# Patient Record
Sex: Male | Born: 1980 | Race: Black or African American | Hispanic: No | Marital: Single | Smoking: Current every day smoker
Health system: Southern US, Community
[De-identification: ages and names within clinical notes are randomized; demographics above are authoritative.]

---

## 2002-11-10 ENCOUNTER — Encounter: Admission: RE | Admit: 2002-11-10 | Discharge: 2002-11-10 | Payer: Self-pay | Admitting: Emergency Medicine

## 2005-11-01 ENCOUNTER — Emergency Department (HOSPITAL_COMMUNITY): Admission: EM | Admit: 2005-11-01 | Discharge: 2005-11-01 | Payer: Self-pay | Admitting: Emergency Medicine

## 2013-05-15 ENCOUNTER — Emergency Department (HOSPITAL_COMMUNITY)
Admission: EM | Admit: 2013-05-15 | Discharge: 2013-05-15 | Disposition: A | Discharge status: Home / Self Care | Payer: 59 | Source: Home / Self Care | Attending: Family Medicine | Admitting: Family Medicine

## 2013-05-15 ENCOUNTER — Encounter (HOSPITAL_COMMUNITY): Payer: Self-pay | Admitting: Emergency Medicine

## 2013-05-15 DIAGNOSIS — I951 Orthostatic hypotension: Secondary | ICD-10-CM

## 2013-05-15 DIAGNOSIS — R9431 Abnormal electrocardiogram [ECG] [EKG]: Secondary | ICD-10-CM

## 2013-05-15 LAB — POCT I-STAT, CHEM 8
BUN: 22 mg/dL (ref 6–23)
Calcium, Ion: 1.27 mmol/L — ABNORMAL HIGH (ref 1.12–1.23)
Chloride: 105 mEq/L (ref 96–112)
Creatinine, Ser: 1.4 mg/dL — ABNORMAL HIGH (ref 0.50–1.35)
Glucose, Bld: 83 mg/dL (ref 70–99)
HCT: 55 % — ABNORMAL HIGH (ref 39.0–52.0)
Hemoglobin: 18.7 g/dL — ABNORMAL HIGH (ref 13.0–17.0)
Potassium: 3.9 mEq/L (ref 3.7–5.3)
Sodium: 142 mEq/L (ref 137–147)
TCO2: 27 mmol/L (ref 0–100)

## 2013-05-15 MED ORDER — SODIUM CHLORIDE 0.9 % IV BOLUS (SEPSIS)
1000.0000 mL | Freq: Once | INTRAVENOUS | Status: AC
  Administered 2013-05-15: 1000 mL via INTRAVENOUS

## 2013-05-15 NOTE — Discharge Instructions (Signed)
Thank you for coming in today. Drink plenty of fluid.  Followup with your primary care Dr. Your creatinine which is a kidney function is somewhat elevated. This is to be rechecked in the near future. Additionally please followup with a cardiologist. Call or go to the emergency room if you get worse, have trouble breathing, have chest pains, or palpitations.    Orthostatic Hypotension Orthostatic hypotension is a sudden fall in blood pressure. It occurs when a person goes from a sitting or lying position to a standing position. CAUSES   Loss of body fluids (dehydration).  Medicines that lower blood pressure.  Sudden changes in posture, such as sudden standing when you have been sitting or lying down.  Taking too much of your medicine. SYMPTOMS   Lightheadedness or dizziness.  Fainting or near-fainting.  A fast heart rate (tachycardia).  Weakness.  Feeling tired (fatigue). DIAGNOSIS  Your caregiver may find the cause of orthostatic hypotension through:  A history and/or physical exam.  Checking your blood pressure. Your caregiver will check your blood pressure when you are:  Lying down.  Sitting.  Standing.  Tilt table testing. In this test, you are placed on a table that goes from a lying position to a standing position. You will be strapped to the table. This test helps to monitor your blood pressure and heart rate when you are in different positions. TREATMENT   If orthostatic hypotension is caused by your medicines, your caregiver will need to adjust your dosage. Do not stop or adjust your medicine on your own.  When changing positions, make these changes slowly. This allows your body to adjust to the different position.  Compression stockings that are worn on your lower legs may be helpful.  Your caregiver may have you consume extra salt. Do not add extra salt to your diet unless directed by your caregiver.  Eat frequent, small meals. Avoid sudden standing  after eating.  Avoid hot showers or excessive heat.  Your caregiver may give you fluids through the vein (intravenous).  Your caregiver may put you on medicine to help enhance fluid retention. SEEK IMMEDIATE MEDICAL CARE IF:   You faint or have a near-fainting episode. Call your local emergency services (911 in [place]  You have or develop chest pain.  You feel sick to your stomach (nauseous) or vomit.  You have a loss of feeling or movement in your arms or legs.  You have difficulty talking, slurred speech, or you are unable to talk.  You have difficulty thinking or have confused thinking. MAKE SURE YOU:   Understand these instructions.  Will watch your condition.  Will get help right away if you are not doing well or get worse. Document Released: 12/12/2001 Document Revised: 03/16/2011 Document Reviewed: 04/06/2008 [facility] Patient Information 2014 [place], [place].
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
## 2013-05-15 NOTE — ED Notes (Signed)
Onset yesterday of feeling nauseated, no vomiting, c/o dizziness regardless of positin of body.  Reports feeling very tired yesterday and sleeping alot

## 2013-07-26 ENCOUNTER — Institutional Professional Consult (permissible substitution): Payer: 59 | Admitting: Cardiology

## 2013-08-09 ENCOUNTER — Encounter: Payer: Self-pay | Admitting: Cardiology

## 2017-05-23 ENCOUNTER — Emergency Department (HOSPITAL_COMMUNITY): Payer: Self-pay

## 2017-05-23 ENCOUNTER — Encounter (HOSPITAL_COMMUNITY): Payer: Self-pay | Admitting: Emergency Medicine

## 2017-05-23 ENCOUNTER — Emergency Department (HOSPITAL_COMMUNITY)
Admission: EM | Admit: 2017-05-23 | Discharge: 2017-05-23 | Disposition: A | Discharge status: Home / Self Care | Payer: Self-pay | Attending: Emergency Medicine | Admitting: Emergency Medicine

## 2017-05-23 DIAGNOSIS — Z789 Other specified health status: Secondary | ICD-10-CM | POA: Insufficient documentation

## 2017-05-23 DIAGNOSIS — S0181XA Laceration without foreign body of other part of head, initial encounter: Secondary | ICD-10-CM

## 2017-05-23 DIAGNOSIS — D75839 Thrombocytosis, unspecified: Secondary | ICD-10-CM

## 2017-05-23 DIAGNOSIS — Z7289 Other problems related to lifestyle: Secondary | ICD-10-CM

## 2017-05-23 DIAGNOSIS — S02401B Maxillary fracture, unspecified, initial encounter for open fracture: Secondary | ICD-10-CM | POA: Insufficient documentation

## 2017-05-23 DIAGNOSIS — S0232XB Fracture of orbital floor, left side, initial encounter for open fracture: Secondary | ICD-10-CM | POA: Insufficient documentation

## 2017-05-23 DIAGNOSIS — Z23 Encounter for immunization: Secondary | ICD-10-CM | POA: Insufficient documentation

## 2017-05-23 DIAGNOSIS — S0502XA Injury of conjunctiva and corneal abrasion without foreign body, left eye, initial encounter: Secondary | ICD-10-CM | POA: Insufficient documentation

## 2017-05-23 DIAGNOSIS — S01412A Laceration without foreign body of left cheek and temporomandibular area, initial encounter: Secondary | ICD-10-CM | POA: Insufficient documentation

## 2017-05-23 DIAGNOSIS — Y939 Activity, unspecified: Secondary | ICD-10-CM | POA: Insufficient documentation

## 2017-05-23 DIAGNOSIS — S0240DB Maxillary fracture, left side, initial encounter for open fracture: Secondary | ICD-10-CM | POA: Insufficient documentation

## 2017-05-23 DIAGNOSIS — D473 Essential (hemorrhagic) thrombocythemia: Secondary | ICD-10-CM | POA: Insufficient documentation

## 2017-05-23 DIAGNOSIS — Y999 Unspecified external cause status: Secondary | ICD-10-CM | POA: Insufficient documentation

## 2017-05-23 DIAGNOSIS — Z79899 Other long term (current) drug therapy: Secondary | ICD-10-CM | POA: Insufficient documentation

## 2017-05-23 DIAGNOSIS — S022XXB Fracture of nasal bones, initial encounter for open fracture: Secondary | ICD-10-CM | POA: Insufficient documentation

## 2017-05-23 DIAGNOSIS — Y929 Unspecified place or not applicable: Secondary | ICD-10-CM | POA: Insufficient documentation

## 2017-05-23 LAB — COMPREHENSIVE METABOLIC PANEL
ALT: 36 U/L (ref 17–63)
AST: 26 U/L (ref 15–41)
Albumin: 4 g/dL (ref 3.5–5.0)
Alkaline Phosphatase: 60 U/L (ref 38–126)
Anion gap: 12 (ref 5–15)
BUN: 15 mg/dL (ref 6–20)
CO2: 23 mmol/L (ref 22–32)
Calcium: 9.3 mg/dL (ref 8.9–10.3)
Chloride: 106 mmol/L (ref 101–111)
Creatinine, Ser: 0.96 mg/dL (ref 0.61–1.24)
GFR calc Af Amer: >60 mL/min (ref >60)
GFR calc non Af Amer: >60 mL/min (ref >60)
Glucose, Bld: 78 mg/dL (ref 65–99)
Potassium: 4.1 mmol/L (ref 3.5–5.1)
Sodium: 141 mmol/L (ref 135–145)
Total Bilirubin: 0.5 mg/dL (ref 0.3–1.2)
Total Protein: 7.9 g/dL (ref 6.5–8.1)

## 2017-05-23 LAB — CBC WITH DIFFERENTIAL/PLATELET
Basophils Absolute: 0 10*3/uL (ref 0.0–0.1)
Basophils Relative: 0 %
Eosinophils Absolute: 0.4 10*3/uL (ref 0.0–0.7)
Eosinophils Relative: 4 %
HCT: 41.8 % (ref 39.0–52.0)
Hemoglobin: 13.8 g/dL (ref 13.0–17.0)
Lymphocytes Relative: 33 %
Lymphs Abs: 3.2 10*3/uL (ref 0.7–4.0)
MCH: 24.1 pg — ABNORMAL LOW (ref 26.0–34.0)
MCHC: 33 g/dL (ref 30.0–36.0)
MCV: 72.9 fL — ABNORMAL LOW (ref 78.0–100.0)
Monocytes Absolute: 0.8 10*3/uL (ref 0.1–1.0)
Monocytes Relative: 8 %
Neutro Abs: 5.4 10*3/uL (ref 1.7–7.7)
Neutrophils Relative %: 55 %
Platelets: 454 10*3/uL — ABNORMAL HIGH (ref 150–400)
RBC: 5.73 MIL/uL (ref 4.22–5.81)
RDW: 15.8 % — ABNORMAL HIGH (ref 11.5–15.5)
WBC: 9.9 10*3/uL (ref 4.0–10.5)

## 2017-05-23 LAB — I-STAT CG4 LACTIC ACID, ED: Lactic Acid, Venous: 2.25 mmol/L (ref 0.5–1.9)

## 2017-05-23 LAB — APTT: aPTT: 27 seconds (ref 24–36)

## 2017-05-23 LAB — ETHANOL: Alcohol, Ethyl (B): 113 mg/dL — ABNORMAL HIGH (ref <10)

## 2017-05-23 LAB — RAPID URINE DRUG SCREEN, HOSP PERFORMED
Amphetamines: NOT DETECTED
Barbiturates: NOT DETECTED
Benzodiazepines: NOT DETECTED
Cocaine: POSITIVE — AB
Opiates: NOT DETECTED
Tetrahydrocannabinol: POSITIVE — AB

## 2017-05-23 LAB — URINALYSIS, ROUTINE W REFLEX MICROSCOPIC
Bilirubin Urine: NEGATIVE
Glucose, UA: NEGATIVE mg/dL
Hgb urine dipstick: NEGATIVE
Ketones, ur: NEGATIVE mg/dL
Leukocytes, UA: NEGATIVE
Nitrite: NEGATIVE
Protein, ur: NEGATIVE mg/dL
Specific Gravity, Urine: 1.021 (ref 1.005–1.030)
pH: 6 (ref 5.0–8.0)

## 2017-05-23 LAB — PROTIME-INR
INR: 1.06
Prothrombin Time: 13.7 seconds (ref 11.4–15.2)

## 2017-05-23 MED ORDER — MORPHINE SULFATE (PF) 4 MG/ML IV SOLN
4.0000 mg | Freq: Once | INTRAVENOUS | Status: AC
  Administered 2017-05-23: 4 mg via INTRAVENOUS
  Filled 2017-05-23: qty 1

## 2017-05-23 MED ORDER — ONDANSETRON HCL 4 MG/2ML IJ SOLN
4.0000 mg | Freq: Once | INTRAMUSCULAR | Status: AC
  Administered 2017-05-23: 4 mg via INTRAVENOUS
  Filled 2017-05-23: qty 2

## 2017-05-23 MED ORDER — SODIUM CHLORIDE 0.9 % IV SOLN
INTRAVENOUS | Status: DC
  Administered 2017-05-23: 16:00:00 via INTRAVENOUS

## 2017-05-23 MED ORDER — FLUORESCEIN SODIUM 1 MG OP STRP
1.0000 | ORAL_STRIP | Freq: Once | OPHTHALMIC | Status: AC
  Administered 2017-05-23: 1 via OPHTHALMIC
  Filled 2017-05-23: qty 1

## 2017-05-23 MED ORDER — CEFAZOLIN SODIUM-DEXTROSE 1-4 GM/50ML-% IV SOLN
1.0000 g | Freq: Once | INTRAVENOUS | Status: AC
  Administered 2017-05-23: 1 g via INTRAVENOUS
  Filled 2017-05-23: qty 50

## 2017-05-23 MED ORDER — TETRACAINE HCL 0.5 % OP SOLN
2.0000 [drp] | Freq: Once | OPHTHALMIC | Status: AC
  Administered 2017-05-23: 2 [drp] via OPHTHALMIC
  Filled 2017-05-23: qty 4

## 2017-05-23 MED ORDER — BACITRACIN ZINC 500 UNIT/GM EX OINT
TOPICAL_OINTMENT | CUTANEOUS | Status: AC
  Filled 2017-05-23: qty 0.9

## 2017-05-23 MED ORDER — TETANUS-DIPHTH-ACELL PERTUSSIS 5-2.5-18.5 LF-MCG/0.5 IM SUSP
0.5000 mL | Freq: Once | INTRAMUSCULAR | Status: AC
  Administered 2017-05-23: 0.5 mL via INTRAMUSCULAR
  Filled 2017-05-23: qty 0.5

## 2017-05-23 MED ORDER — DOXYCYCLINE HYCLATE 100 MG PO CAPS: 100.0000 mg | ORAL_CAPSULE | Freq: Two times a day (BID) | ORAL | 0 refills | Status: DC

## 2017-05-23 MED ORDER — NAPROXEN 500 MG PO TABS: 500.0000 mg | ORAL_TABLET | Freq: Two times a day (BID) | ORAL | 0 refills | Status: DC | PRN

## 2017-05-23 MED ORDER — HYDROCODONE-ACETAMINOPHEN 5-325 MG PO TABS: 1.0000 | ORAL_TABLET | Freq: Four times a day (QID) | ORAL | 0 refills | Status: DC | PRN

## 2017-05-23 MED ORDER — SODIUM CHLORIDE 0.9 % IV BOLUS
1000.0000 mL | Freq: Once | INTRAVENOUS | Status: AC
  Administered 2017-05-23: 1000 mL via INTRAVENOUS

## 2017-05-23 MED ORDER — CIPROFLOXACIN HCL 0.3 % OP SOLN: 2.0000 [drp] | Freq: Four times a day (QID) | OPHTHALMIC | 0 refills | Status: DC
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
## 2017-05-23 NOTE — Discharge Instructions (Addendum)
You have several fractures in the left side of your face. You will need to eat a liquid diet until cleared by the ENT doctor. Take antibiotics as directed until completed, since you had open fractures. Also use the eye drops as directed, and avoid rubbing your eyes. Do not wear contact lenses.  Keep wounds clean with mild soap and water. Keep area covered with a bandage, keep bandage dry, and do not submerge in water. DO NOT PUT ANY OINTMENTS/CREAMS/LOTIONS ON THE AREA AS THIS WILL PREMATURELY DISSOLVE THE GLUE. Ice and elevate areas for additional pain and swelling relief. Alternate between naprosyn and norco for additional pain relief, but don't drive or operate machinery while taking norco. STOP DRINKING ALCOHOL!  Call the ENT doctor tomorrow at the number listed above, and set up an appointment for Tuesday 05/25/17 for ongoing management of your fractures and wounds. Monitor area for signs of infection to include, but not limited to: increasing pain, spreading redness, drainage/pus, worsening swelling, or fevers. Return to emergency department for emergent changing or worsening symptoms.

## 2017-05-25 DIAGNOSIS — S022XXA Fracture of nasal bones, initial encounter for closed fracture: Secondary | ICD-10-CM | POA: Insufficient documentation

## 2017-05-25 DIAGNOSIS — S0240DB Maxillary fracture, left side, initial encounter for open fracture: Secondary | ICD-10-CM | POA: Insufficient documentation

## 2017-09-19 ENCOUNTER — Encounter (HOSPITAL_COMMUNITY): Payer: Self-pay | Admitting: Emergency Medicine

## 2017-09-19 ENCOUNTER — Emergency Department (HOSPITAL_COMMUNITY)
Admission: EM | Admit: 2017-09-19 | Discharge: 2017-09-19 | Disposition: A | Discharge status: Home / Self Care | Payer: Self-pay | Attending: Emergency Medicine | Admitting: Emergency Medicine

## 2017-09-19 ENCOUNTER — Emergency Department (HOSPITAL_COMMUNITY): Payer: Self-pay

## 2017-09-19 ENCOUNTER — Other Ambulatory Visit: Payer: Self-pay

## 2017-09-19 DIAGNOSIS — W108XXA Fall (on) (from) other stairs and steps, initial encounter: Secondary | ICD-10-CM | POA: Insufficient documentation

## 2017-09-19 DIAGNOSIS — Y939 Activity, unspecified: Secondary | ICD-10-CM | POA: Insufficient documentation

## 2017-09-19 DIAGNOSIS — Y999 Unspecified external cause status: Secondary | ICD-10-CM | POA: Insufficient documentation

## 2017-09-19 DIAGNOSIS — S82191A Other fracture of upper end of right tibia, initial encounter for closed fracture: Secondary | ICD-10-CM | POA: Insufficient documentation

## 2017-09-19 DIAGNOSIS — S82114A Nondisplaced fracture of right tibial spine, initial encounter for closed fracture: Secondary | ICD-10-CM | POA: Insufficient documentation

## 2017-09-19 DIAGNOSIS — F17200 Nicotine dependence, unspecified, uncomplicated: Secondary | ICD-10-CM | POA: Insufficient documentation

## 2017-09-19 DIAGNOSIS — Y929 Unspecified place or not applicable: Secondary | ICD-10-CM | POA: Insufficient documentation

## 2017-09-19 DIAGNOSIS — Z79899 Other long term (current) drug therapy: Secondary | ICD-10-CM | POA: Insufficient documentation

## 2017-09-19 MED ORDER — HYDROCODONE-ACETAMINOPHEN 5-325 MG PO TABS: 1.0000 | ORAL_TABLET | Freq: Four times a day (QID) | ORAL | 0 refills | Status: DC | PRN

## 2017-09-19 MED ORDER — HYDROCODONE-ACETAMINOPHEN 5-325 MG PO TABS
1.0000 | ORAL_TABLET | Freq: Once | ORAL | Status: AC
  Administered 2017-09-19: 1 via ORAL
  Filled 2017-09-19: qty 1

## 2017-09-19 NOTE — ED Triage Notes (Signed)
Pt fell last night on concrete making impact with R knee, pt c/o swelling and pain.

## 2017-09-19 NOTE — ED Notes (Signed)
Ortho tech at bedside 

## 2017-09-19 NOTE — Discharge Instructions (Addendum)
Your x-rays today showed possible ACL rupture in addition to some fractures.  I have attached the x-ray report for your information.  1. Medications: Alternate 600 mg of ibuprofen and [phone] mg of Tylenol every 3 hours as needed for pain. Do not exceed 4000 mg of Tylenol daily.  Take ibuprofen with food to avoid upset stomach issues.  You can take hydrocodone as needed for severe pain but do not drive, drink alcohol, operate heavy machinery, or make important decisions while taking this medicine.  It may make you drowsy.  You can also cut these tablets in half.  Be aware this medication also contains Tylenol. 2. Treatment: rest, ice, elevate and use brace, drink plenty of fluids, weightbearing as tolerated. 3. Follow Up: Please followup with orthopedics as directed or your PCP in 1 week if no improvement for discussion of your diagnoses and further evaluation after today's visit;  Please return to the ER for worsening symptoms or other concerns such as worsening swelling, redness of the skin, fevers, loss of pulses, or loss of feeling

## 2017-09-19 NOTE — ED Provider Notes (Signed)
[facility] DEPT Provider Note   CSN: [id number] Arrival date & time: 09/19/17  1109     History   Chief Complaint Chief Complaint  Patient presents with  . Knee Pain    R    HPI Jose Dixon is a 37 y.o. male with no significant past medical history presents for evaluation of acute onset, constant right knee pain secondary to injury yesterday morning.  He states that he was walking up some steps outside when he misstepped and landed on his right knee on the porch.  Denies head injury or loss of consciousness.  Since then has had constant sharp aching pain to the right knee radiating to the posterior aspect of the right knee.  Pain worsens with bending and he has not been able to bear weight on the extremity since.  No numbness or tingling to the extremity.  Mild relief with ice.  He does note an area of swelling to the lateral aspect of the right knee which was not present before the injury.  The history is provided by the patient.    History reviewed. No pertinent past medical history.  There are no active problems to display for this patient.   History reviewed. No pertinent surgical history.      Home Medications    Prior to Admission medications   Medication Sig Start Date End Date Taking? Authorizing Provider  ciprofloxacin (CILOXAN) 0.3 % ophthalmic solution Place 2 drops into the left eye 4 (four) times daily. x7 days 05/23/17   Street, [place], PA-C  doxycycline (VIBRAMYCIN) 100 MG capsule Take 1 capsule (100 mg total) by mouth 2 (two) times daily. One po bid x 7 days 05/23/17   Street, [place], [place]  HYDROcodone-acetaminophen (NORCO/VICODIN) 5-325 MG tablet Take 1 tablet by mouth every 6 (six) hours as needed for severe pain. 09/19/17   Jibril Mcminn A, PA-C  hydroxypropyl methylcellulose / hypromellose (ISOPTO TEARS / GONIOVISC) 2.5 % ophthalmic solution Place 1 drop into both eyes 3 (three) times daily as needed for dry eyes.     [provider]  naproxen (NAPROSYN) 500 MG tablet Take 1 tablet (500 mg total) by mouth 2 (two) times daily as needed for mild pain, moderate pain or headache (TAKE WITH MEALS.). 05/23/17   Street, [place], PA-C  Phenylephrine-Ibuprofen (ADVIL SINUS CONGESTION & PAIN) 10-200 MG TABS Take 1 tablet by mouth every 6 (six) hours as needed. Allergies    [provider]    Family History History reviewed. No pertinent family history.  Social History Social History   Tobacco Use  . Smoking status: Current Every Day Smoker  Substance Use Topics  . Alcohol use: Yes  . Drug use: No     Allergies   Patient has no known allergies.   Review of Systems Review of Systems  Constitutional: Negative for fever.  Musculoskeletal: Positive for arthralgias, gait problem and joint swelling.  Neurological: Negative for syncope, weakness and numbness.     Physical Exam Updated Vital Signs BP (!) 156/81 (BP Location: Right Arm)   Pulse (!) 106   Temp 98.6 F (37 C) (Oral)   Resp 20   SpO2 97%   Physical Exam  Constitutional: He appears well-developed and well-nourished. No distress.  HENT:  Head: Normocephalic and atraumatic.  Eyes: Conjunctivae are normal. Right eye exhibits no discharge. Left eye exhibits no discharge.  Neck: No JVD present. No tracheal deviation present.  Cardiovascular: Normal rate and intact distal pulses.  2+ DP/PT pulses bilaterally, compartments are soft.  Pulmonary/Chest: Effort normal.  Abdominal: He exhibits no distension.  Musculoskeletal: He exhibits edema and tenderness.       Right knee: He exhibits swelling, effusion, bony tenderness and MCL laxity. He exhibits no laceration and no erythema. Tenderness found. Medial joint line, lateral joint line and MCL tenderness noted.  Maximally tender to palpation overlying the tibial tuberosity and inferior lateral aspect of the right knee.  There is an area of swelling superior laterally which is  mildly tender to palpation.  There is some valgus instability.  Negative anterior/posterior drawer test.  Extremely limited range of motion of the right knee with flexion and extension.  No quadriceps tendon deformity.  5/5 strength of BLE major muscle groups.  Neurological: He is alert.  Fluent speech, no facial droop, sensation intact to soft touch of bilateral lower extremities.  Unable to assess gait secondary to pain but patient is able to pull himself up to a standing position.  Skin: Skin is warm and dry. No erythema.  Psychiatric: He has a normal mood and affect. His behavior is normal.  Nursing note and vitals reviewed.    ED Treatments / Results  Labs (all labs ordered are listed, but only abnormal results are displayed) Labs Reviewed - No data to display  EKG None  Radiology Dg Knee Complete 4 Views Right  Result Date: 09/19/2017 CLINICAL DATA:  [place] and injured right knee. EXAM: RIGHT KNEE - COMPLETE 4+ VIEW COMPARISON:  None. FINDINGS: There is an avulsion type fracture involving the lateral cortex of the tibia, likely a Segond fracture. These are typically associated with ACL ruptures. On the lateral oblique fracture there may be a small fracture of the base of the lateral tibial spine. No other bony abnormalities are identified. There is a large joint effusion. IMPRESSION: 1. Avulsion fracture involving the lateral tibial cortex (Segond fracture) and possible small fracture at the base of the lateral tibial spine. 2. These injuries could be associated with an ACL rupture. There is also large joint effusion. MRI may be helpful for further evaluation. Electronically Signed   By: Marijo Sanes M.D.   On: 09/19/2017 12:18    Procedures Procedures (including critical care time)  Medications Ordered in ED Medications  HYDROcodone-acetaminophen (NORCO/VICODIN) 5-325 MG per tablet 1 tablet (1 tablet Oral Given 09/19/17 1239)     Initial Impression / Assessment and Plan / ED  Course  I have reviewed the triage vital signs and the nursing notes.  Pertinent labs & imaging results that were available during my care of the patient were reviewed by me and considered in my medical decision making (see chart for details).     Right knee pain secondary to mechanical fall yesterday.  He is afebrile, mildly tachycardic and hypertensive though this could be in response to pain, resolved on reevaluation.  He is neurovascularly intact.  Cannot ambulate secondary to pain.  Radiographs show avulsion fracture involving the lateral tibial cortex and possible fracture at the base of the lateral tibial spine.  Suggestive of possible ACL rupture.  Compartments are soft, no evidence of DVT or secondary skin infection.  Discussed with Dr. Ninfa Linden with orthopedic surgery who recommends knee immobilizer, crutches, weightbearing as tolerated and follow-up with orthopedics for reevaluation.  MRI can occur on an outpatient basis.  Will discharge with small amount of hydrocodone for severe breakthrough pain.  Discussed of appropriate use of this medication.  RICE therapy otherwise indicated and discussed.  Discussed strict ED return precautions.  Patient and patient's partner verbalized understanding of and agreement with plan and patient is stable for discharge home at this time.  Final Clinical Impressions(s) / ED Diagnoses   Final diagnoses:  Nondisplaced fracture of right tibial spine, initial encounter for closed fracture  Other closed fracture of proximal end of right tibia, initial encounter    ED Discharge Orders         Ordered    HYDROcodone-acetaminophen (NORCO/VICODIN) 5-325 MG tablet  Every 6 hours PRN     09/19/17 1311           Daviana Haymaker, [place] A, PA-C 09/19/17 1326    Malvin Johns, MD 09/19/17 1528

## 2017-09-19 NOTE — ED Notes (Signed)
Bed: WTR6 Expected date:  Expected time:  Means of arrival:  Comments: 

## 2019-01-11 ENCOUNTER — Other Ambulatory Visit: Payer: Self-pay

## 2019-01-11 DIAGNOSIS — Z20822 Contact with and (suspected) exposure to covid-19: Secondary | ICD-10-CM

## 2019-01-12 LAB — NOVEL CORONAVIRUS, NAA: SARS-CoV-2, NAA: NOT DETECTED

## 2019-05-25 ENCOUNTER — Ambulatory Visit: Payer: Self-pay | Attending: Family

## 2019-05-25 DIAGNOSIS — Z23 Encounter for immunization: Secondary | ICD-10-CM

## 2019-05-25 NOTE — Progress Notes (Signed)
   Covid-19 Vaccination Clinic  Name:  Quentel Whary    MRN: [id number] DOB: February 27, 1980  05/25/2019  Mr. Student was observed post Covid-19 immunization for 15 minutes without incident. He was provided with Vaccine Information Sheet and instruction to access the V-Safe system.   Mr. Ayson was instructed to call 911 with any severe reactions post vaccine: Marland Kitchen Difficulty breathing  . Swelling of face and throat  . A fast heartbeat  . A bad rash all over body  . Dizziness and weakness   Immunizations Administered    Name Date Dose VIS Date Route   Moderna COVID-19 Vaccine 05/25/2019  4:49 PM 0.5 mL 12/2018 Intramuscular   Manufacturer: Moderna   Lot: [id number]   [place]:[id number]

## 2019-06-20 ENCOUNTER — Ambulatory Visit: Payer: Self-pay | Attending: Family

## 2019-06-20 DIAGNOSIS — Z23 Encounter for immunization: Secondary | ICD-10-CM

## 2019-06-20 NOTE — Progress Notes (Signed)
   Covid-19 Vaccination Clinic  Name:  Jose Dixon    MRN: [id number] DOB: 1980/08/21  06/20/2019  [name] was observed post Covid-19 immunization for 15 minutes without incident. He was provided with Vaccine Information Sheet and instruction to access the V-Safe system.   [name] was instructed to call 911 with any severe reactions post vaccine: Marland Kitchen Difficulty breathing  . Swelling of face and throat  . A fast heartbeat  . A bad rash all over body  . Dizziness and weakness   Immunizations Administered    Name Date Dose VIS Date Route   Moderna COVID-19 Vaccine 06/20/2019  4:15 PM 0.5 mL 12/2018 Intramuscular   Manufacturer: Moderna   Lot: [id number]   [place]: [id number]

## 2019-06-22 ENCOUNTER — Ambulatory Visit: Payer: Self-pay

## 2020-04-03 ENCOUNTER — Emergency Department (HOSPITAL_COMMUNITY)
Admission: EM | Admit: 2020-04-03 | Discharge: 2020-04-03 | Disposition: A | Discharge status: Home / Self Care | Payer: Self-pay | Attending: Emergency Medicine | Admitting: Emergency Medicine

## 2020-04-03 ENCOUNTER — Emergency Department (HOSPITAL_COMMUNITY): Payer: Self-pay

## 2020-04-03 DIAGNOSIS — R0602 Shortness of breath: Secondary | ICD-10-CM | POA: Insufficient documentation

## 2020-04-03 DIAGNOSIS — F17200 Nicotine dependence, unspecified, uncomplicated: Secondary | ICD-10-CM | POA: Insufficient documentation

## 2020-04-03 DIAGNOSIS — Z20822 Contact with and (suspected) exposure to covid-19: Secondary | ICD-10-CM | POA: Insufficient documentation

## 2020-04-03 MED ORDER — PREDNISONE 20 MG PO TABS: 40.0000 mg | ORAL_TABLET | Freq: Every day | ORAL | 0 refills | Status: DC

## 2020-04-03 MED ORDER — ALBUTEROL SULFATE HFA 108 (90 BASE) MCG/ACT IN AERS
2.0000 | INHALATION_SPRAY | Freq: Once | RESPIRATORY_TRACT | Status: AC
  Administered 2020-04-03: 2 via RESPIRATORY_TRACT
  Filled 2020-04-03: qty 6.7

## 2020-04-03 NOTE — Discharge Instructions (Addendum)
As discussed, today's evaluation has been generally reassuring.  There is no x-ray evidence for pneumonia.  Your difficulty breathing may be due to Covid infection, though this is unlikely.  However, a test has been sent and results will be available overnight. You have been provided an albuterol inhaler, please use this every 4-6 hours as needed for additional relief of your breathing.  In addition, you have been prescribed steroids, please take these for the next 4 days.  Please continue taking your medication for seasonal allergies.  Return here for concerning changes in your condition.

## 2020-04-03 NOTE — ED Provider Notes (Signed)
[facility] DEPT Provider Note   CSN: [id number] Arrival date & time: 04/03/20  1400     History Chief Complaint  Patient presents with  . Shortness of Breath    Jose Dixon is a 40 y.o. male.  HPI Patient presents concern of dyspnea.  He is generally well, smokes only socially, has no obvious recent sick contacts.  However, beginning yesterday the patient developed dyspnea.  No chest pain, no fever, no syncope.  No clear alleviating or exacerbating factors.  He does have a history of seasonal allergies, and has been taking his medication until the past day or 2.  He notes that this is not similar to prior seasonal allergy exacerbations.     No past medical history on file.  There are no problems to display for this patient.   No past surgical history on file.     No family history on file.  Social History   Tobacco Use  . Smoking status: Current Every Day Smoker  Vaping Use  . Vaping Use: Never used  Substance Use Topics  . Alcohol use: Yes  . Drug use: No    Home Medications Prior to Admission medications   Medication Sig Start Date End Date Taking? Authorizing Provider  predniSONE (DELTASONE) 20 MG tablet Take 2 tablets (40 mg total) by mouth daily with breakfast. For the next four days 04/03/20  Yes Carmin Muskrat, MD  ciprofloxacin (CILOXAN) 0.3 % ophthalmic solution Place 2 drops into the left eye 4 (four) times daily. x7 days 05/23/17   Street, [place], PA-C  doxycycline (VIBRAMYCIN) 100 MG capsule Take 1 capsule (100 mg total) by mouth 2 (two) times daily. One po bid x 7 days 05/23/17   Street, [place], [place]  HYDROcodone-acetaminophen (NORCO/VICODIN) 5-325 MG tablet Take 1 tablet by mouth every 6 (six) hours as needed for severe pain. 09/19/17   Fawze, Mina A, PA-C  hydroxypropyl methylcellulose / hypromellose (ISOPTO TEARS / GONIOVISC) 2.5 % ophthalmic solution Place 1 drop into both eyes 3 (three) times daily as needed for  dry eyes.    [provider]  naproxen (NAPROSYN) 500 MG tablet Take 1 tablet (500 mg total) by mouth 2 (two) times daily as needed for mild pain, moderate pain or headache (TAKE WITH MEALS.). 05/23/17   Street, [place], PA-C  Phenylephrine-Ibuprofen (ADVIL SINUS CONGESTION & PAIN) 10-200 MG TABS Take 1 tablet by mouth every 6 (six) hours as needed. Allergies    [provider]    Allergies    Patient has no known allergies.  Review of Systems   Review of Systems  Constitutional:       Per HPI, otherwise negative  HENT:       Per HPI, otherwise negative  Respiratory:       Per HPI, otherwise negative  Cardiovascular:       Per HPI, otherwise negative  Gastrointestinal: Negative for vomiting.  Endocrine:       Negative aside from HPI  Genitourinary:       Neg aside from HPI   Musculoskeletal:       Per HPI, otherwise negative  Skin: Negative.   Neurological: Negative for syncope.    Physical Exam Updated Vital Signs BP (!) 156/98 (BP Location: Left Arm)   Pulse 98   Temp 98.1 F (36.7 C) (Oral)   Resp (!) 21   Ht 6\' 2"  (1.88 m)   Wt 97.5 kg   SpO2 100%   BMI 27.60 kg/m  Physical Exam Vitals and nursing note reviewed.  Constitutional:      General: He is not in acute distress.    Appearance: He is well-developed.  HENT:     Head: Normocephalic and atraumatic.  Eyes:     Conjunctiva/sclera: Conjunctivae normal.  Cardiovascular:     Rate and Rhythm: Normal rate and regular rhythm.  Pulmonary:     Effort: Pulmonary effort is normal. No respiratory distress.     Breath sounds: No stridor.  Abdominal:     General: There is no distension.  Skin:    General: Skin is warm and dry.  Neurological:     Mental Status: He is alert and oriented to person, place, and time.     ED Results / Procedures / Treatments   Labs (all labs ordered are listed, but only abnormal results are displayed) Labs Reviewed  SARS CORONAVIRUS 2 (TAT 6-24 HRS)     EKG None  Radiology DG Chest 2 View  Result Date: 04/03/2020 CLINICAL DATA:  Shortness of breath and chest pain EXAM: CHEST - 2 VIEW COMPARISON:  November 01, 2005 FINDINGS: The heart size and mediastinal contours are within normal limits. Both lungs are clear. The visualized skeletal structures are unremarkable. IMPRESSION: No active cardiopulmonary disease. Electronically Signed   By: Prudencio Pair M.D.   On: 04/03/2020 15:06    Procedures Procedures   Medications Ordered in ED Medications  albuterol (VENTOLIN HFA) 108 (90 Base) MCG/ACT inhaler 2 puff (has no administration in time range)    ED Course  I have reviewed the triage vital signs and the nursing notes.  Pertinent labs & imaging results that were available during my care of the patient were reviewed by me and considered in my medical decision making (see chart for details).  After the initial evaluation I reviewed the patient's x-ray, and demonstrated to the patient at bedside. We discussed his reassuring vital signs, reassuring x-ray, no evidence for pneumonia.  With no wheezing, no pain, low suspicion for acute bronchitis, low suspicion for ACS.  Patient has no neurologic complaints, has no tachycardia, low suspicion for dissection. Some suspicion for seasonal allergy related dyspnea, given his history, otherwise reassuring evaluation.  Patient amenable to initiation of therapy here, discharged in stable condition. Final Clinical Impression(s) / ED Diagnoses Final diagnoses:  SOB (shortness of breath)    Rx / DC Orders ED Discharge Orders         Ordered    predniSONE (DELTASONE) 20 MG tablet  Daily with breakfast        04/03/20 1711           Carmin Muskrat, MD 04/03/20 1713

## 2020-04-03 NOTE — ED Triage Notes (Signed)
Pt arrived via walk in, c/o SOB and fever this morning, well controlled with medications. States he was around other this weekend, no one known sick but wanted to make everything was okay.

## 2020-04-04 LAB — SARS CORONAVIRUS 2 (TAT 6-24 HRS): SARS Coronavirus 2: NEGATIVE

## 2020-08-28 ENCOUNTER — Encounter (HOSPITAL_COMMUNITY): Payer: Self-pay

## 2020-08-28 ENCOUNTER — Other Ambulatory Visit: Payer: Self-pay

## 2020-08-28 ENCOUNTER — Ambulatory Visit (HOSPITAL_COMMUNITY)
Admission: EM | Admit: 2020-08-28 | Discharge: 2020-08-28 | Disposition: A | Discharge status: Home / Self Care | Payer: Self-pay | Attending: Internal Medicine | Admitting: Internal Medicine

## 2020-08-28 DIAGNOSIS — U071 COVID-19: Secondary | ICD-10-CM

## 2020-08-28 MED ORDER — ACETAMINOPHEN 325 MG PO TABS
ORAL_TABLET | ORAL | Status: AC
  Filled 2020-08-28: qty 2

## 2020-08-28 MED ORDER — ACETAMINOPHEN 325 MG PO TABS
650.0000 mg | ORAL_TABLET | Freq: Once | ORAL | Status: AC
  Administered 2020-08-28: 650 mg via ORAL

## 2020-08-28 MED ORDER — PROMETHAZINE-DM 6.25-15 MG/5ML PO SYRP: 5.0000 mL | ORAL_SOLUTION | Freq: Four times a day (QID) | ORAL | 0 refills | Status: DC | PRN

## 2020-08-28 MED ORDER — BENZONATATE 100 MG PO CAPS: 200.0000 mg | ORAL_CAPSULE | Freq: Three times a day (TID) | ORAL | 0 refills | Status: DC | PRN

## 2020-08-28 NOTE — Discharge Instructions (Addendum)
Please quarantine for at least 5 days from your symptom onset or until you are without a fever for at least 24 hours without the use of medications.  You can take the [facility] as needed for cough.  You can take the Promethazine DM as needed for cough at night.  Do not take the Promethazine DM before driving as it can make you sleepy.  You can take Tylenol and/or Ibuprofen as needed for fever reduction and pain relief.   For cough: honey 1/2 to 1 teaspoon (you can dilute the honey in water or another fluid).  You can also use guaifenesin and dextromethorphan for cough. You can use a humidifier for chest congestion and cough.  If you don't have a humidifier, you can sit in the bathroom with the hot shower running.     For sore throat: try warm salt water gargles, cepacol lozenges, throat spray, warm tea or water with lemon/honey, popsicles or ice, or OTC cold relief medicine for throat discomfort.    For congestion: take a daily anti-histamine like Zyrtec, Claritin, and a oral decongestant, such as pseudoephedrine.  You can also use Flonase 1-2 sprays in each nostril daily.    It is important to stay hydrated: drink plenty of fluids (water, gatorade/powerade/pedialyte, juices, or teas) to keep your throat moisturized and help further relieve irritation/discomfort.   Return or go to the Emergency Department if symptoms worsen or do not improve in the next few days.

## 2020-08-28 NOTE — ED Provider Notes (Signed)
[place]    CSN: [id number] Arrival date & time: 08/28/20  [id number]      History   Chief Complaint Chief Complaint  Patient presents with   Cough   Chills    HPI Jose Dixon is a 40 y.o. male.   Patient here for evaluation of cough, chills, body aches, and shortness of breath that have been ongoing since Monday.  Reports taking an at home COVID test today that was positive.  Reports taking DayQuil and NyQuil with minimal symptom relief.  Denies any trauma, injury, or other precipitating event.  Denies any fevers, chest pain, N/V/D, numbness, tingling, weakness, abdominal pain, or headaches.    The history is provided by the patient.  Cough Associated symptoms: fever and myalgias    History reviewed. No pertinent past medical history.  There are no problems to display for this patient.   History reviewed. No pertinent surgical history.     Home Medications    Prior to Admission medications   Medication Sig Start Date End Date Taking? Authorizing Provider  benzonatate (TESSALON PERLES) 100 MG capsule Take 2 capsules (200 mg total) by mouth 3 (three) times daily as needed for cough. 08/28/20  Yes Pearson Forster, NP  promethazine-dextromethorphan (PROMETHAZINE-DM) 6.25-15 MG/5ML syrup Take 5 mLs by mouth 4 (four) times daily as needed for cough. 08/28/20  Yes Pearson Forster, NP  ciprofloxacin (CILOXAN) 0.3 % ophthalmic solution Place 2 drops into the left eye 4 (four) times daily. x7 days 05/23/17   Street, [place], PA-C  doxycycline (VIBRAMYCIN) 100 MG capsule Take 1 capsule (100 mg total) by mouth 2 (two) times daily. One po bid x 7 days 05/23/17   Street, [place], [place]  HYDROcodone-acetaminophen (NORCO/VICODIN) 5-325 MG tablet Take 1 tablet by mouth every 6 (six) hours as needed for severe pain. 09/19/17   Fawze, Mina A, PA-C  hydroxypropyl methylcellulose / hypromellose (ISOPTO TEARS / GONIOVISC) 2.5 % ophthalmic solution Place 1 drop into both eyes 3  (three) times daily as needed for dry eyes.    [provider]  naproxen (NAPROSYN) 500 MG tablet Take 1 tablet (500 mg total) by mouth 2 (two) times daily as needed for mild pain, moderate pain or headache (TAKE WITH MEALS.). 05/23/17   Street, [place], PA-C  Phenylephrine-Ibuprofen (ADVIL SINUS CONGESTION & PAIN) 10-200 MG TABS Take 1 tablet by mouth every 6 (six) hours as needed. Allergies    [provider]  predniSONE (DELTASONE) 20 MG tablet Take 2 tablets (40 mg total) by mouth daily with breakfast. For the next four days 04/03/20   Carmin Muskrat, MD    Family History History reviewed. No pertinent family history.  Social History Social History   Tobacco Use   Smoking status: Every Day   Smokeless tobacco: Never  Vaping Use   Vaping Use: Never used  Substance Use Topics   Alcohol use: Yes   Drug use: No     Allergies   Patient has no known allergies.   Review of Systems Review of Systems  Constitutional:  Positive for fatigue and fever.  Respiratory:  Positive for cough.   Musculoskeletal:  Positive for myalgias.  All other systems reviewed and are negative.   Physical Exam Triage Vital Signs ED Triage Vitals  Enc Vitals Group     BP 08/28/20 1950 (!) 125/100     Pulse Rate 08/28/20 1950 (!) 135     Resp 08/28/20 1950 18     Temp 08/28/20 1950 (!)  102 F (38.9 C)     Temp Source 08/28/20 1950 Oral     SpO2 08/28/20 1950 98 %     Weight --      Height --      Head Circumference --      Peak Flow --      Pain Score 08/28/20 1948 7     Pain Loc --      Pain Edu? --      Excl. in [place]? --    No data found.  Updated Vital Signs BP (!) 125/100 (BP Location: Right Arm)   Pulse (!) 135   Temp (!) 102 F (38.9 C) (Oral)   Resp 18   SpO2 98%   Visual Acuity Right Eye Distance:   Left Eye Distance:   Bilateral Distance:    Right Eye Near:   Left Eye Near:    Bilateral Near:     Physical Exam Vitals and nursing note reviewed.   Constitutional:      General: He is not in acute distress.    Appearance: Normal appearance. He is not ill-appearing, toxic-appearing or diaphoretic.  HENT:     Head: Normocephalic and atraumatic.     Nose: Congestion present.     Mouth/Throat:     Mouth: Mucous membranes are moist.     Pharynx: Posterior oropharyngeal erythema present.  Eyes:     Conjunctiva/sclera: Conjunctivae normal.  Cardiovascular:     Rate and Rhythm: Normal rate and regular rhythm.     Pulses: Normal pulses.     Heart sounds: Normal heart sounds.  Pulmonary:     Effort: Pulmonary effort is normal.     Breath sounds: Normal breath sounds.  Abdominal:     General: Abdomen is flat.  Musculoskeletal:        General: Normal range of motion.     Cervical back: Normal range of motion.  Skin:    General: Skin is warm and dry.  Neurological:     General: No focal deficit present.     Mental Status: He is alert and oriented to person, place, and time.  Psychiatric:        Mood and Affect: Mood normal.     UC Treatments / Results  Labs (all labs ordered are listed, but only abnormal results are displayed) Labs Reviewed - No data to display  EKG   Radiology No results found.  Procedures Procedures (including critical care time)  Medications Ordered in UC Medications  acetaminophen (TYLENOL) tablet 650 mg (650 mg Oral Given 08/28/20 1955)    Initial Impression / Assessment and Plan / UC Course  I have reviewed the triage vital signs and the nursing notes.  Pertinent labs & imaging results that were available during my care of the patient were reviewed by me and considered in my medical decision making (see chart for details).    Assessment negative for red flags or concerns.  COVID-19.  As patient had positive home test we do not need to retest at this time.  Discussed current CDC guidelines for quarantine.  May take Tessalon Perles and Promethazine DM as needed for cough.  Instructed not to  drive after taking Promethazine DM as it can cause drowsiness.  Discussed conservative symptom management as described in discharge instructions.  Follow-up as needed. Final Clinical Impressions(s) / UC Diagnoses   Final diagnoses:  [id number]     Discharge Instructions      Please quarantine for at least 5  days from your symptom onset or until you are without a fever for at least 24 hours without the use of medications.  You can take the [facility] as needed for cough.  You can take the Promethazine DM as needed for cough at night.  Do not take the Promethazine DM before driving as it can make you sleepy.  You can take Tylenol and/or Ibuprofen as needed for fever reduction and pain relief.   For cough: honey 1/2 to 1 teaspoon (you can dilute the honey in water or another fluid).  You can also use guaifenesin and dextromethorphan for cough. You can use a humidifier for chest congestion and cough.  If you don't have a humidifier, you can sit in the bathroom with the hot shower running.     For sore throat: try warm salt water gargles, cepacol lozenges, throat spray, warm tea or water with lemon/honey, popsicles or ice, or OTC cold relief medicine for throat discomfort.    For congestion: take a daily anti-histamine like Zyrtec, Claritin, and a oral decongestant, such as pseudoephedrine.  You can also use Flonase 1-2 sprays in each nostril daily.    It is important to stay hydrated: drink plenty of fluids (water, gatorade/powerade/pedialyte, juices, or teas) to keep your throat moisturized and help further relieve irritation/discomfort.   Return or go to the Emergency Department if symptoms worsen or do not improve in the next few days.      ED Prescriptions     Medication Sig Dispense Auth. Provider   promethazine-dextromethorphan (PROMETHAZINE-DM) 6.25-15 MG/5ML syrup Take 5 mLs by mouth 4 (four) times daily as needed for cough. 118 mL Pearson Forster, NP   benzonatate (TESSALON  PERLES) 100 MG capsule Take 2 capsules (200 mg total) by mouth 3 (three) times daily as needed for cough. 20 capsule Pearson Forster, NP      PDMP not reviewed this encounter.   Pearson Forster, NP 08/28/20 2032

## 2020-08-28 NOTE — ED Triage Notes (Signed)
-  Cough chills, body aches, SOB, pt in no distress at this time  Took at home Covid test + today Started Saturday

## 2020-12-20 NOTE — Progress Notes (Signed)
Erroneous encounter

## 2020-12-24 ENCOUNTER — Encounter: Payer: Self-pay | Admitting: Family

## 2020-12-24 DIAGNOSIS — Z0000 Encounter for general adult medical examination without abnormal findings: Secondary | ICD-10-CM

## 2020-12-24 DIAGNOSIS — Z13220 Encounter for screening for lipoid disorders: Secondary | ICD-10-CM

## 2020-12-24 DIAGNOSIS — Z1329 Encounter for screening for other suspected endocrine disorder: Secondary | ICD-10-CM

## 2020-12-24 DIAGNOSIS — Z13228 Encounter for screening for other metabolic disorders: Secondary | ICD-10-CM

## 2020-12-24 DIAGNOSIS — Z130 Encounter for screening for diseases of the blood and blood-forming organs and certain disorders involving the immune mechanism: Secondary | ICD-10-CM

## 2020-12-24 DIAGNOSIS — Z1159 Encounter for screening for other viral diseases: Secondary | ICD-10-CM

## 2020-12-24 DIAGNOSIS — Z7689 Persons encountering health services in other specified circumstances: Secondary | ICD-10-CM

## 2020-12-24 DIAGNOSIS — Z131 Encounter for screening for diabetes mellitus: Secondary | ICD-10-CM

## 2020-12-24 DIAGNOSIS — Z114 Encounter for screening for human immunodeficiency virus [HIV]: Secondary | ICD-10-CM

## 2020-12-25 ENCOUNTER — Telehealth: Payer: Commercial Managed Care - PPO | Admitting: Nurse Practitioner

## 2020-12-26 ENCOUNTER — Telehealth (INDEPENDENT_AMBULATORY_CARE_PROVIDER_SITE_OTHER): Payer: Commercial Managed Care - PPO | Admitting: Nurse Practitioner

## 2020-12-26 ENCOUNTER — Other Ambulatory Visit: Payer: Self-pay

## 2020-12-26 ENCOUNTER — Encounter: Payer: Self-pay | Admitting: Nurse Practitioner

## 2020-12-26 DIAGNOSIS — R0683 Snoring: Secondary | ICD-10-CM | POA: Diagnosis not present

## 2020-12-26 DIAGNOSIS — J302 Other seasonal allergic rhinitis: Secondary | ICD-10-CM

## 2020-12-26 DIAGNOSIS — R0681 Apnea, not elsewhere classified: Secondary | ICD-10-CM

## 2020-12-26 MED ORDER — CETIRIZINE HCL 10 MG PO TABS: 10.0000 mg | ORAL_TABLET | Freq: Every day | ORAL | 0 refills | Status: AC

## 2020-12-26 MED ORDER — FLUTICASONE PROPIONATE 50 MCG/ACT NA SUSP: 2.0000 | Freq: Every day | NASAL | 6 refills | Status: AC

## 2020-12-26 NOTE — Progress Notes (Signed)
Virtual Visit via Telephone Note  I connected with Rayquan Zehren on 12/26/20 at  1:20 PM EST by telephone and verified that I am speaking with the correct person using two identifiers.  Location: Patient: home Provider: office   I discussed the limitations, risks, security and privacy concerns of performing an evaluation and management service by telephone and the availability of in person appointments. I also discussed with the patient that there may be a patient responsible charge related to this service. The patient expressed understanding and agreed to proceed.   History of Present Illness:  Patient presents today to establish care.  He states that he is currently not on any prescription medications.  He states that he has no significant health history that he is aware of.  Patient does have a couple new concerns today.  He states that over the past couple years he has issues with sinus drainage.  He has also been having issues with postnasal drip at night.  Patient states that he does snore at night and his partner has witnessed him having apneic episodes.  Patient states that he does wake up frequently at night.  We discussed the concern for sleep apnea.  We will start patient on antihistamine and Flonase.  We will place referral for sleep eval. Denies f/c/s, n/v/d, hemoptysis, PND, chest pain or edema.     Observations/Objective:  Vitals with BMI 08/28/2020 04/03/2020 09/19/2017  Height - 6\' 2"  -  Weight - 215 lbs -  BMI - 62.95 -  Systolic 284 132 440  Diastolic 102 98 91  Pulse 135 98 99      Assessment and Plan:  Encounter to establish care Post nasal drip Snoring:  Will order Zyrtec  Will order Flonase  Will place referral for sleep eval for sleep evaluation  Heart healthy diet  Stay active  Follow-up:  Follow-up with Amy or Dr. Redmond Pulling within 1 month for complete physical     I discussed the assessment and treatment plan with the patient. The patient was  provided an opportunity to ask questions and all were answered. The patient agreed with the plan and demonstrated an understanding of the instructions.   The patient was advised to call back or seek an in-person evaluation if the symptoms worsen or if the condition fails to improve as anticipated.  I provided 23 minutes of non-face-to-face time during this encounter.   Fenton Foy, NP

## 2020-12-26 NOTE — Patient Instructions (Addendum)
Encounter to establish care Post nasal drip Snoring:  Will order Zyrtec  Will order Flonase  Will place referral for sleep eval for sleep evaluation  Heart healthy diet  Stay active  Follow-up:  Follow-up with Amy or Dr. Redmond Pulling within 1 month for complete physical

## 2020-12-31 ENCOUNTER — Ambulatory Visit: Payer: Self-pay | Admitting: Family Medicine

## 2021-01-26 NOTE — Progress Notes (Signed)
Erroneous encounter

## 2021-01-31 ENCOUNTER — Encounter: Payer: Commercial Managed Care - PPO | Admitting: Family

## 2021-01-31 DIAGNOSIS — Z13220 Encounter for screening for lipoid disorders: Secondary | ICD-10-CM

## 2021-01-31 DIAGNOSIS — Z13228 Encounter for screening for other metabolic disorders: Secondary | ICD-10-CM

## 2021-01-31 DIAGNOSIS — Z1329 Encounter for screening for other suspected endocrine disorder: Secondary | ICD-10-CM

## 2021-01-31 DIAGNOSIS — Z131 Encounter for screening for diabetes mellitus: Secondary | ICD-10-CM

## 2021-01-31 DIAGNOSIS — Z130 Encounter for screening for diseases of the blood and blood-forming organs and certain disorders involving the immune mechanism: Secondary | ICD-10-CM

## 2021-01-31 DIAGNOSIS — Z114 Encounter for screening for human immunodeficiency virus [HIV]: Secondary | ICD-10-CM

## 2021-01-31 DIAGNOSIS — Z1159 Encounter for screening for other viral diseases: Secondary | ICD-10-CM

## 2021-01-31 DIAGNOSIS — Z0000 Encounter for general adult medical examination without abnormal findings: Secondary | ICD-10-CM

## 2021-10-09 ENCOUNTER — Encounter (HOSPITAL_COMMUNITY): Payer: Self-pay | Admitting: Emergency Medicine

## 2021-10-09 ENCOUNTER — Emergency Department (HOSPITAL_COMMUNITY)
Admission: EM | Admit: 2021-10-09 | Discharge: 2021-10-09 | Disposition: A | Discharge status: Home / Self Care | Payer: BC Managed Care – PPO | Attending: Emergency Medicine | Admitting: Emergency Medicine

## 2021-10-09 DIAGNOSIS — M545 Low back pain, unspecified: Secondary | ICD-10-CM | POA: Diagnosis not present

## 2021-10-09 MED ORDER — CYCLOBENZAPRINE HCL 10 MG PO TABS
10.0000 mg | ORAL_TABLET | Freq: Two times a day (BID) | ORAL | 0 refills | Status: DC | PRN
Start: 2021-10-09 — End: 2021-10-09

## 2021-10-09 MED ORDER — KETOROLAC TROMETHAMINE 15 MG/ML IJ SOLN
15.0000 mg | Freq: Once | INTRAMUSCULAR | Status: AC
Start: 2021-10-09 — End: 2021-10-09
  Administered 2021-10-09: 15 mg via INTRAMUSCULAR
  Filled 2021-10-09: qty 1

## 2021-10-09 MED ORDER — CYCLOBENZAPRINE HCL 10 MG PO TABS: 10.0000 mg | ORAL_TABLET | Freq: Two times a day (BID) | ORAL | 0 refills | Status: AC | PRN

## 2021-10-09 NOTE — ED Triage Notes (Addendum)
Patient c/o lower back pain since yesterday. Denies trauma, injury. Reports pain worsens with movement. Denies urinary symptoms.

## 2021-10-09 NOTE — Discharge Instructions (Signed)
Return for any problem.  ?

## 2021-10-09 NOTE — ED Provider Notes (Signed)
[facility] DEPT Provider Note   CSN: [id number] Arrival date & time: 10/09/21  1442     History  Chief Complaint  Patient presents with   Back Pain    Jose Dixon is a 41 y.o. male.  41 year old male with prior medical history as detailed below presents for evaluation.  Patient reports that he does lots of heavy lifting at work.  Patient reports that over the course the last 24 hours he developed diffuse lower back, muscular pain.  Patient's pain is worse with standing or with heavy lifting.  He denies weakness or pain in his lower extremities.  He denies fever.  He denies abdominal pain.  Denies urinary symptoms.  He took some Tylenol at home with minimal improvement his pain.  He is requesting a work note.  The history is provided by the patient and medical records.       Home Medications Prior to Admission medications   Medication Sig Start Date End Date Taking? Authorizing Provider  cyclobenzaprine (FLEXERIL) 10 MG tablet Take 1 tablet (10 mg total) by mouth 2 (two) times daily as needed for muscle spasms. 10/09/21  Yes Valarie Merino, MD  cetirizine (ZYRTEC) 10 MG tablet Take 1 tablet (10 mg total) by mouth daily. 12/26/20 01/25/21  Fenton Foy, NP  fluticasone (FLONASE) 50 MCG/ACT nasal spray Place 2 sprays into both nostrils daily. 12/26/20   Fenton Foy, NP      Allergies    Patient has no known allergies.    Review of Systems   Review of Systems  All other systems reviewed and are negative.   Physical Exam Updated Vital Signs BP 127/88   Pulse (!) 104   Temp 98.9 F (37.2 C) (Oral)   Resp 18   Ht '6\' 2"'$  (1.88 m)   Wt 86.6 kg   SpO2 99%   BMI 24.52 kg/m  Physical Exam Vitals and nursing note reviewed.  Constitutional:      General: He is not in acute distress.    Appearance: Normal appearance. He is well-developed.  HENT:     Head: Normocephalic and atraumatic.  Eyes:     Conjunctiva/sclera: Conjunctivae  normal.     Pupils: Pupils are equal, round, and reactive to light.  Cardiovascular:     Rate and Rhythm: Normal rate and regular rhythm.     Heart sounds: Normal heart sounds.  Pulmonary:     Effort: Pulmonary effort is normal. No respiratory distress.     Breath sounds: Normal breath sounds.  Abdominal:     General: There is no distension.     Palpations: Abdomen is soft.     Tenderness: There is no abdominal tenderness.  Musculoskeletal:        General: No deformity. Normal range of motion.     Cervical back: Normal range of motion and neck supple.     Comments: Mild diffuse muscular pain to the lower lumbar paraspinal areas.  No midline tenderness.  No overlying erythema or rash.  Both lower extremities have 5 out of 5 strength.  Normal gait.    Skin:    General: Skin is warm and dry.  Neurological:     General: No focal deficit present.     Mental Status: He is alert and oriented to person, place, and time.     ED Results / Procedures / Treatments   Labs (all labs ordered are listed, but only abnormal results are displayed) Labs Reviewed -  No data to display  EKG None  Radiology No results found.  Procedures Procedures    Medications Ordered in ED Medications  ketorolac (TORADOL) 15 MG/ML injection 15 mg (15 mg Intramuscular Given 10/09/21 1719)    ED Course/ Medical Decision Making/ A&P                           Medical Decision Making Risk Prescription drug management.    Medical Screen Complete  This patient presented to the ED with complaint of back pain.  This complaint involves an extensive number of treatment options. The initial differential diagnosis includes, but is not limited to, likely musculoskeletal pain  This presentation is: Acute, Self-Limited, Previously Undiagnosed, Uncertain Prognosis, and Complicated  Patient is presenting with low back pain consistent with likely muscular strain.  Patient reports significant heavy lifting  at work.  He feels that his pain is likely related to an injury that occurred while lifting at work.    Patient feels improved with Toradol administration.  Patient understands need for close outpatient follow-up.  Strict return precautions given understood.    Additional history obtained: External records from outside sources obtained and reviewed including prior ED visits and prior Inpatient records.  Medicines ordered:  I ordered medication including Toradol for pain Reevaluation of the patient after these medicines showed that the patient: improved   Problem List / ED Course:  Low back pain   Reevaluation:  After the interventions noted above, I reevaluated the patient and found that they have: improved  Disposition:  After consideration of the diagnostic results and the patients response to treatment, I feel that the patent would benefit from close outpatient follow-up.          Final Clinical Impression(s) / ED Diagnoses Final diagnoses:  Acute low back pain without sciatica, unspecified back pain laterality    Rx / DC Orders ED Discharge Orders          Ordered    cyclobenzaprine (FLEXERIL) 10 MG tablet  2 times daily PRN        10/09/21 1757              Valarie Merino, MD 10/09/21 1905

## 2022-03-27 ENCOUNTER — Encounter: Payer: BC Managed Care – PPO | Admitting: Family Medicine

## 2022-03-31 ENCOUNTER — Ambulatory Visit (INDEPENDENT_AMBULATORY_CARE_PROVIDER_SITE_OTHER): Payer: BC Managed Care – PPO | Admitting: Family Medicine

## 2022-03-31 ENCOUNTER — Encounter: Payer: Self-pay | Admitting: Family Medicine

## 2022-03-31 VITALS — BP 136/88 | HR 87 | Temp 98.1°F | Resp 16 | Ht 74.0 in | Wt 191.8 lb

## 2022-03-31 DIAGNOSIS — Z0000 Encounter for general adult medical examination without abnormal findings: Secondary | ICD-10-CM | POA: Diagnosis not present

## 2022-03-31 DIAGNOSIS — Z13220 Encounter for screening for lipoid disorders: Secondary | ICD-10-CM

## 2022-03-31 DIAGNOSIS — Z114 Encounter for screening for human immunodeficiency virus [HIV]: Secondary | ICD-10-CM | POA: Diagnosis not present

## 2022-03-31 DIAGNOSIS — Z1159 Encounter for screening for other viral diseases: Secondary | ICD-10-CM | POA: Diagnosis not present

## 2022-03-31 DIAGNOSIS — Z130 Encounter for screening for diseases of the blood and blood-forming organs and certain disorders involving the immune mechanism: Secondary | ICD-10-CM | POA: Diagnosis not present

## 2022-03-31 DIAGNOSIS — Z7689 Persons encountering health services in other specified circumstances: Secondary | ICD-10-CM

## 2022-03-31 NOTE — Progress Notes (Signed)
Patient is here to established care with provider today. Patient has many health concern they would like to discuss with provider today  Care gaps discuss at appointment today  

## 2022-04-01 LAB — CMP14+EGFR
ALT: 31 IU/L (ref 0–44)
AST: 29 IU/L (ref 0–40)
Albumin/Globulin Ratio: 1.6 (ref 1.2–2.2)
Albumin: 4.4 g/dL (ref 4.1–5.1)
Alkaline Phosphatase: 73 IU/L (ref 44–121)
BUN/Creatinine Ratio: 12 (ref 9–20)
BUN: 11 mg/dL (ref 6–24)
Bilirubin Total: 0.9 mg/dL (ref 0.0–1.2)
CO2: 21 mmol/L (ref 20–29)
Calcium: 10 mg/dL (ref 8.7–10.2)
Chloride: 104 mmol/L (ref 96–106)
Creatinine, Ser: 0.91 mg/dL (ref 0.76–1.27)
Globulin, Total: 2.8 g/dL (ref 1.5–4.5)
Glucose: 90 mg/dL (ref 70–99)
Potassium: 4.8 mmol/L (ref 3.5–5.2)
Sodium: 144 mmol/L (ref 134–144)
Total Protein: 7.2 g/dL (ref 6.0–8.5)
eGFR: 109 mL/min/{1.73_m2} (ref >59)

## 2022-04-01 LAB — LIPID PANEL
Chol/HDL Ratio: 3.8 ratio (ref 0.0–5.0)
Cholesterol, Total: 171 mg/dL (ref 100–199)
HDL: 45 mg/dL (ref >39)
LDL Chol Calc (NIH): 94 mg/dL (ref 0–99)
Triglycerides: 187 mg/dL — ABNORMAL HIGH (ref 0–149)
VLDL Cholesterol Cal: 32 mg/dL (ref 5–40)

## 2022-04-01 LAB — CBC WITH DIFFERENTIAL/PLATELET
Basophils Absolute: 0.1 10*3/uL (ref 0.0–0.2)
Basos: 1 %
EOS (ABSOLUTE): 0.4 10*3/uL (ref 0.0–0.4)
Eos: 8 %
Hematocrit: 44.7 % (ref 37.5–51.0)
Hemoglobin: 13.9 g/dL (ref 13.0–17.7)
Immature Grans (Abs): 0 10*3/uL (ref 0.0–0.1)
Immature Granulocytes: 0 %
Lymphocytes Absolute: 2.4 10*3/uL (ref 0.7–3.1)
Lymphs: 43 %
MCH: 24.2 pg — ABNORMAL LOW (ref 26.6–33.0)
MCHC: 31.1 g/dL — ABNORMAL LOW (ref 31.5–35.7)
MCV: 78 fL — ABNORMAL LOW (ref 79–97)
Monocytes Absolute: 0.5 10*3/uL (ref 0.1–0.9)
Monocytes: 8 %
Neutrophils Absolute: 2.2 10*3/uL (ref 1.4–7.0)
Neutrophils: 40 %
Platelets: 389 10*3/uL (ref 150–450)
RBC: 5.74 x10E6/uL (ref 4.14–5.80)
RDW: 17.8 % — ABNORMAL HIGH (ref 11.6–15.4)
WBC: 5.5 10*3/uL (ref 3.4–10.8)

## 2022-04-01 LAB — HIV ANTIBODY (ROUTINE TESTING W REFLEX): HIV Screen 4th Generation wRfx: NONREACTIVE

## 2022-04-01 LAB — HEPATITIS C ANTIBODY: Hep C Virus Ab: NONREACTIVE

## 2022-04-03 ENCOUNTER — Encounter: Payer: Self-pay | Admitting: Family Medicine

## 2022-04-03 NOTE — Progress Notes (Signed)
New Patient Office Visit  Subjective    Patient ID: Jose Dixon, male    DOB: 09-Feb-1980  Age: 42 y.o. MRN: [id number]  CC: No chief complaint on file.   HPI [facility] presents to establish care and for routine annual exam. Patient denies acute comp[laints   Outpatient Encounter Medications as of 03/31/2022  Medication Sig   cyclobenzaprine (FLEXERIL) 10 MG tablet Take 1 tablet (10 mg total) by mouth 2 (two) times daily as needed for muscle spasms.   fluticasone (FLONASE) 50 MCG/ACT nasal spray Place 2 sprays into both nostrils daily.   cetirizine (ZYRTEC) 10 MG tablet Take 1 tablet (10 mg total) by mouth daily.   No facility-administered encounter medications on file as of 03/31/2022.    History reviewed. No pertinent past medical history.  History reviewed. No pertinent surgical history.  Family History  Problem Relation Age of Onset   Breast cancer Maternal Aunt    Prostate cancer Paternal Grandfather     Social History   Socioeconomic History   Marital status: Single    Spouse name: Not on file   Number of children: Not on file   Years of education: Not on file   Highest education level: Not on file  Occupational History   Not on file  Tobacco Use   Smoking status: Every Day   Smokeless tobacco: Never  Vaping Use   Vaping Use: Never used  Substance and Sexual Activity   Alcohol use: Yes   Drug use: No   Sexual activity: Not on file  Other Topics Concern   Not on file  Social History Narrative   Not on file   Social Determinants of Health   Financial Resource Strain: Not on file  Food Insecurity: Not on file  Transportation Needs: Not on file  Physical Activity: Not on file  Stress: Not on file  Social Connections: Not on file  Intimate Partner Violence: Not on file    Review of Systems  All other systems reviewed and are negative.       Objective    BP 136/88   Pulse 87   Temp 98.1 F (36.7 C) (Oral)   Resp 16   Ht 6\' 2"   (1.88 m)   Wt 191 lb 12.8 oz (87 kg)   SpO2 98%   BMI 24.63 kg/m   Physical Exam Vitals and nursing note reviewed.  Constitutional:      General: He is not in acute distress. HENT:     Head: Normocephalic and atraumatic.     Right Ear: Tympanic membrane, ear canal and external ear normal.     Left Ear: Tympanic membrane, ear canal and external ear normal.     Nose: Nose normal.     Mouth/Throat:     Mouth: Mucous membranes are moist.     Pharynx: Oropharynx is clear.  Eyes:     Conjunctiva/sclera: Conjunctivae normal.     Pupils: Pupils are equal, round, and reactive to light.  Neck:     Thyroid: No thyromegaly.  Cardiovascular:     Rate and Rhythm: Normal rate and regular rhythm.     Heart sounds: Normal heart sounds. No murmur heard. Pulmonary:     Effort: Pulmonary effort is normal.     Breath sounds: Normal breath sounds.  Abdominal:     General: There is no distension.     Palpations: Abdomen is soft. There is no mass.     Tenderness: There is no abdominal  tenderness.     Hernia: There is no hernia in the left inguinal area or right inguinal area.  Genitourinary:    Penis: Normal and circumcised.      Testes: Normal.  Musculoskeletal:        General: Normal range of motion.     Cervical back: Normal range of motion and neck supple.     Right lower leg: No edema.     Left lower leg: No edema.  Skin:    General: Skin is warm and dry.  Neurological:     General: No focal deficit present.     Mental Status: He is alert and oriented to person, place, and time. Mental status is at baseline.  Psychiatric:        Mood and Affect: Mood normal.        Behavior: Behavior normal.         Assessment & Plan:   1. Annual physical exam  - CMP14+EGFR  2. Screening for deficiency anemia  - CBC with Differential  3. Screening for lipid disorders  - Lipid Panel  4. Screening for HIV (human immunodeficiency virus)  - HIV antibody (with reflex)  5. Need for  hepatitis C screening test  - Hepatitis C Antibody  6. Encounter to establish care     Return in about 1 year (around 03/31/2023) for physical.   Becky Sax, MD

## 2022-08-23 IMAGING — CR DG CHEST 2V
2 series · 2 of 2 positions shown · non-contrast
Comparison: November 01, 2005

CLINICAL DATA: Shortness of breath and chest pain

EXAM:
CHEST - 2 VIEW

[w chest pa]
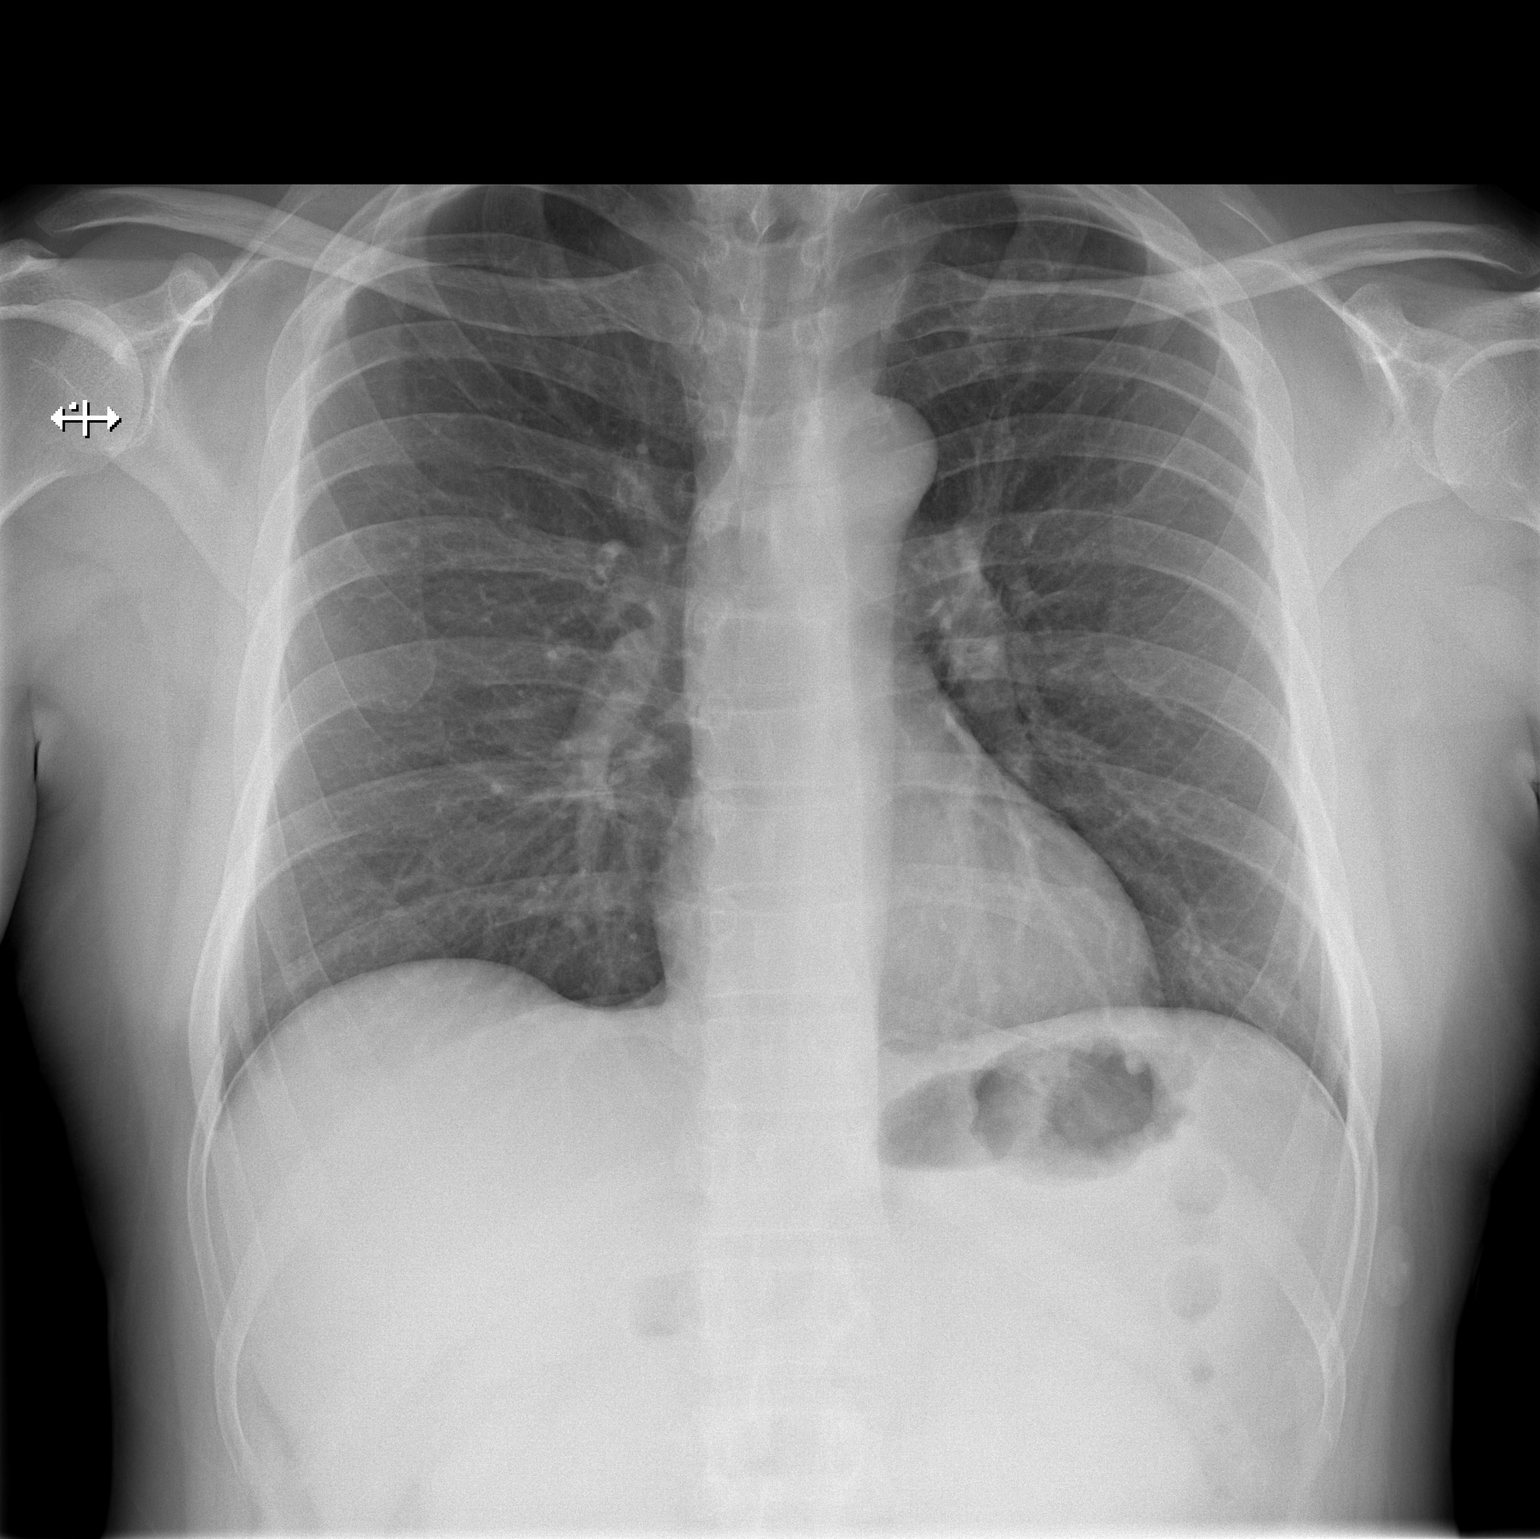

[w chest lat]
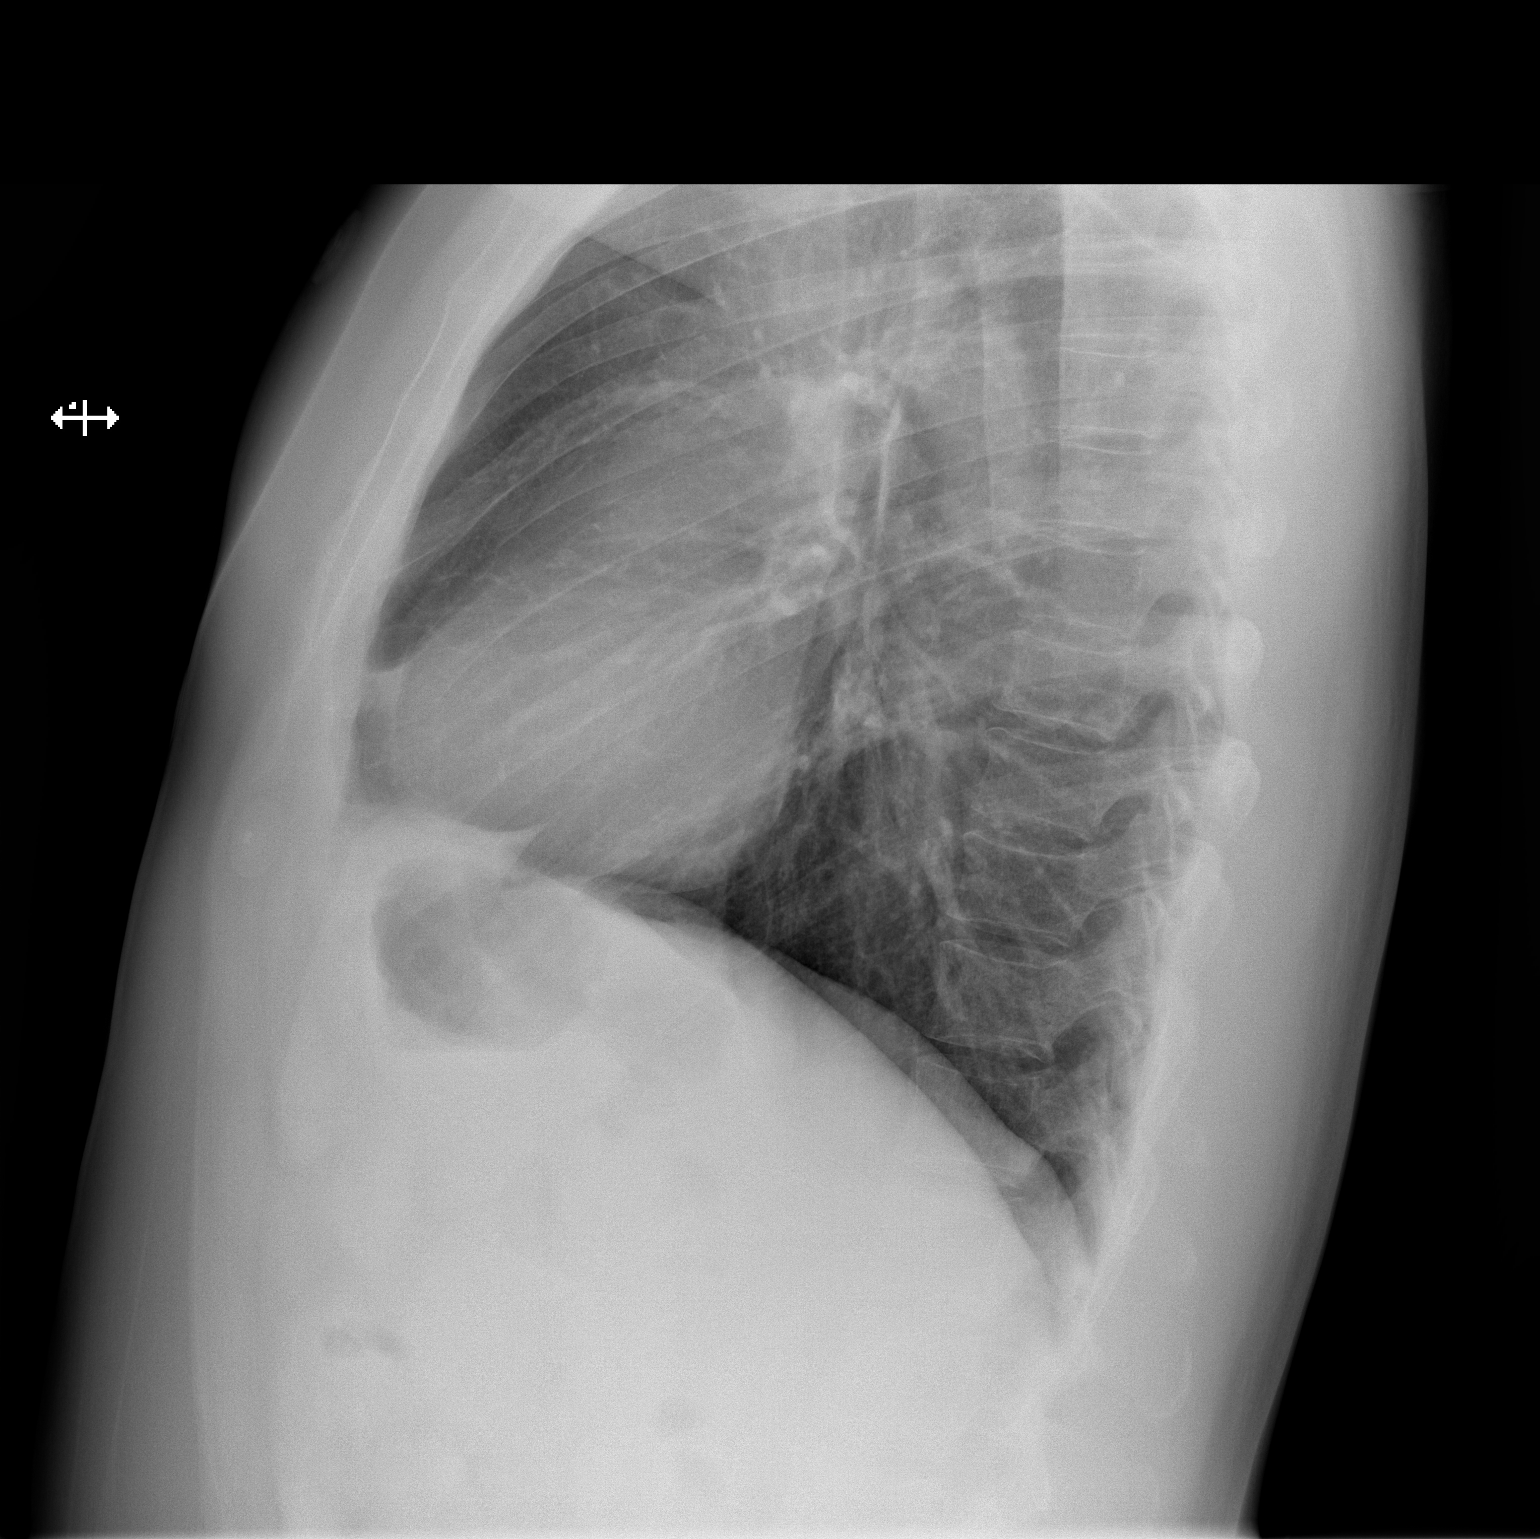

[2 of 2 positions shown; findings below may reference images not displayed]

FINDINGS: The heart size and mediastinal contours are within normal limits.
Both lungs are clear. The visualized skeletal structures are
unremarkable.
IMPRESSION: No active cardiopulmonary disease.
